# Patient Record
Sex: Male | Born: 1995 | Race: White | Hispanic: No | Marital: Single
Health system: Southern US, Community
[De-identification: ages and names within clinical notes are randomized; demographics above are authoritative.]

---
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
## 2017-01-24 ENCOUNTER — Ambulatory Visit: Payer: Self-pay | Admitting: General Surgery

## 2017-05-30 IMAGING — US US SCROTUM
1 series · 14 of 25 positions shown · non-contrast
Comparison: None.

CLINICAL DATA: Patient with right scrotal pain and swelling for 2
weeks.

EXAM:
SCROTAL ULTRASOUND
DOPPLER ULTRASOUND OF THE TESTICLES
TECHNIQUE: Complete ultrasound examination of the testicles, epididymis, and
other scrotal structures was performed. Color and spectral Doppler
ultrasound were also utilized to evaluate blood flow to the
testicles.

[Series 1: us scrotum · 0.07mm/px · 14 of 41 slices shown]
[im 1/41]
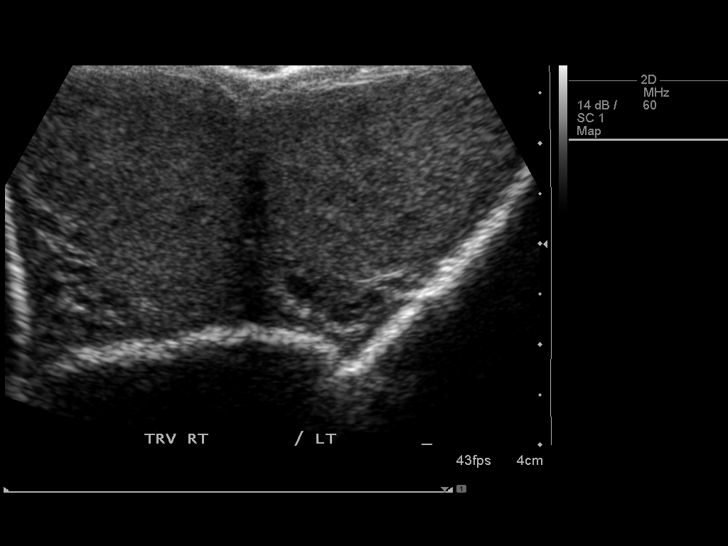
[im 4/41]
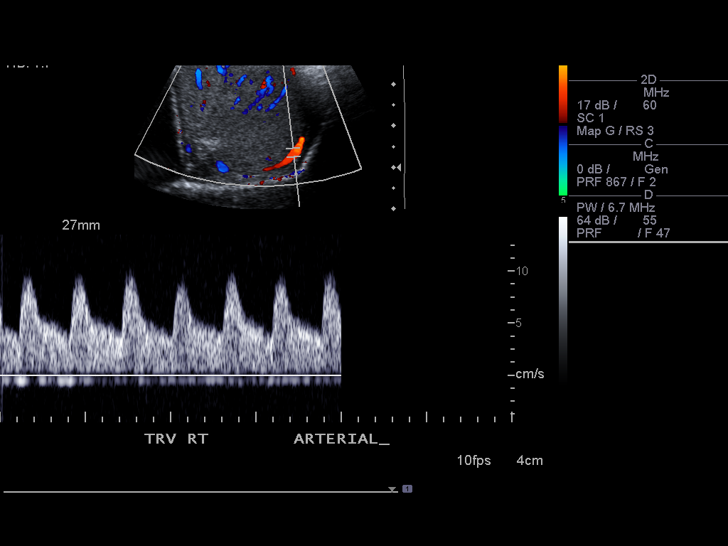
[im 7/41]
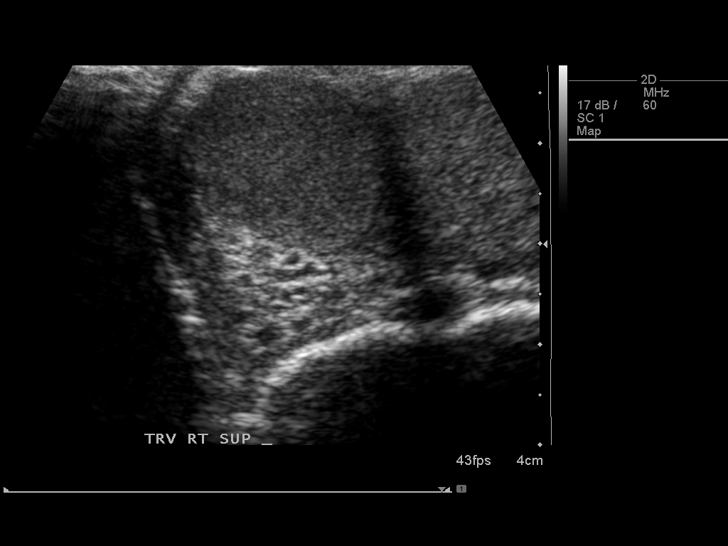
[im 11/41]
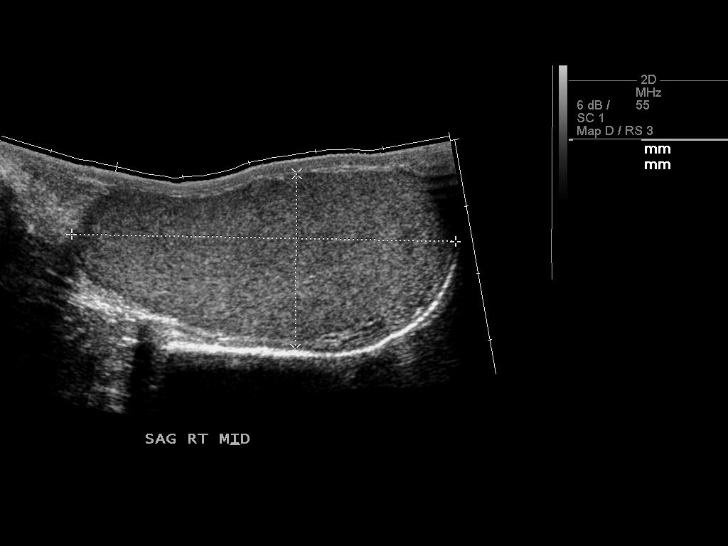
[im 14/41]
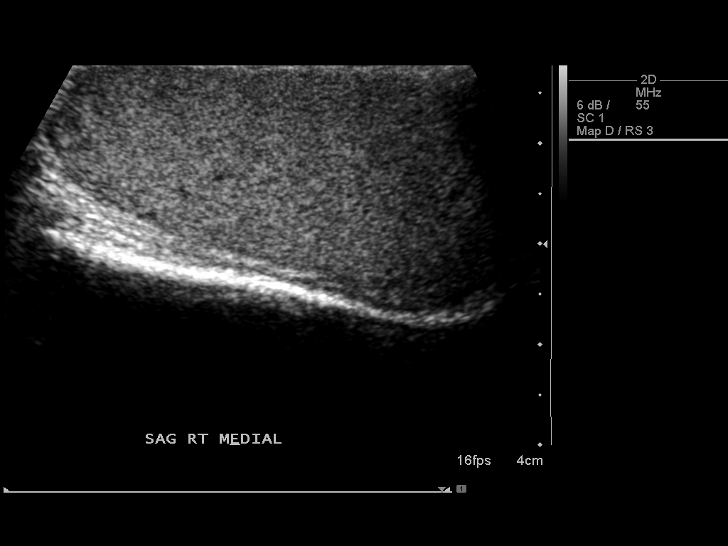
[im 16/41]
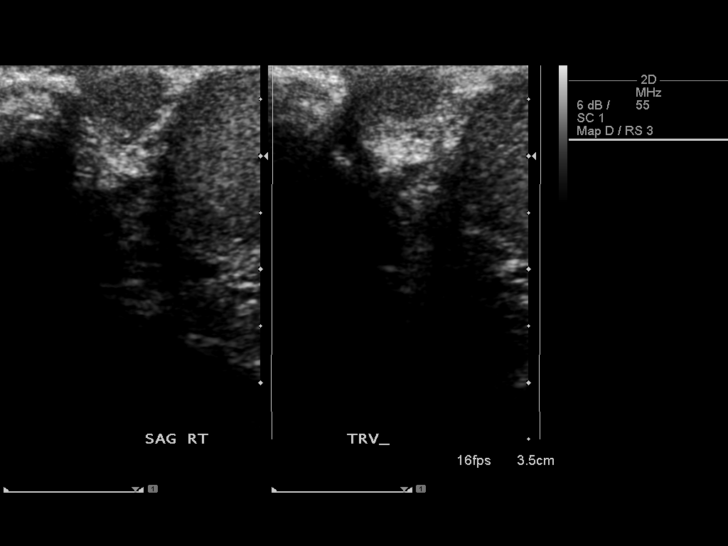
[im 19/41]
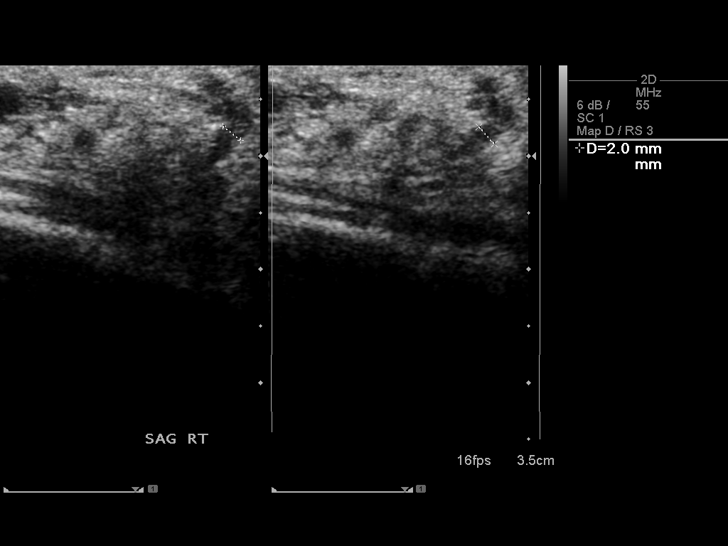
[im 22/41]
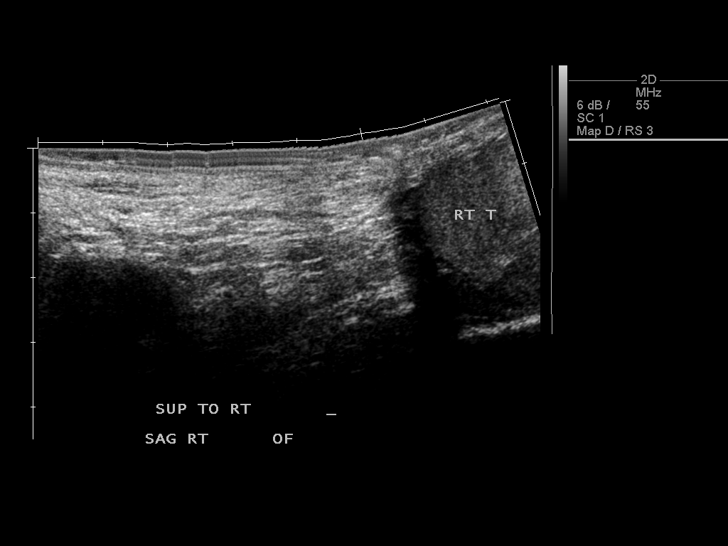
[im 26/41]
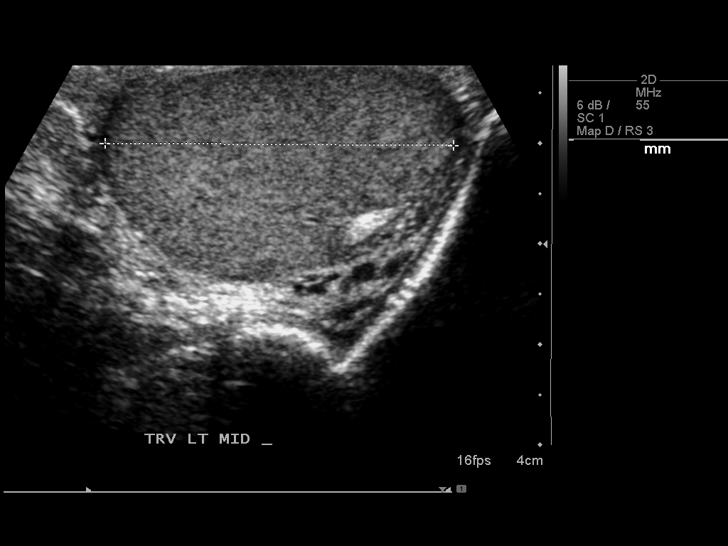
[im 27/41]
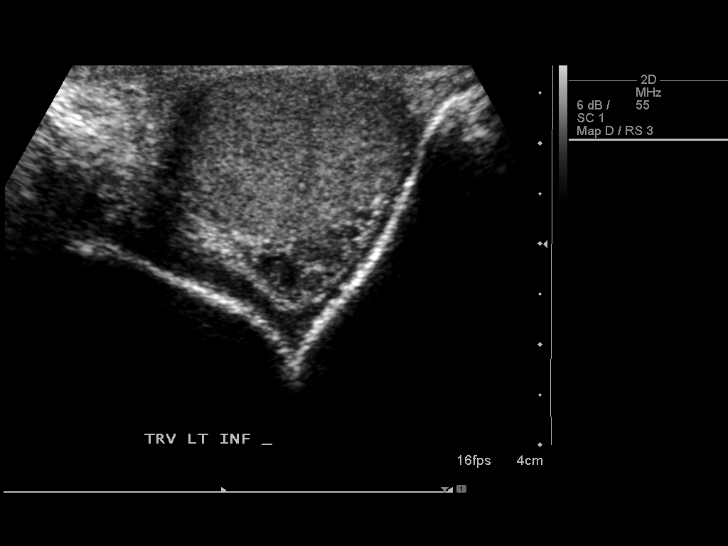
[im 31/41]
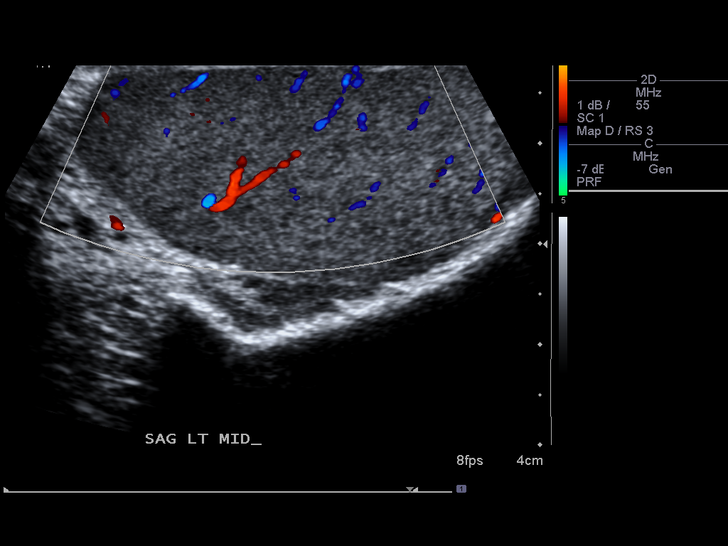
[im 34/41]
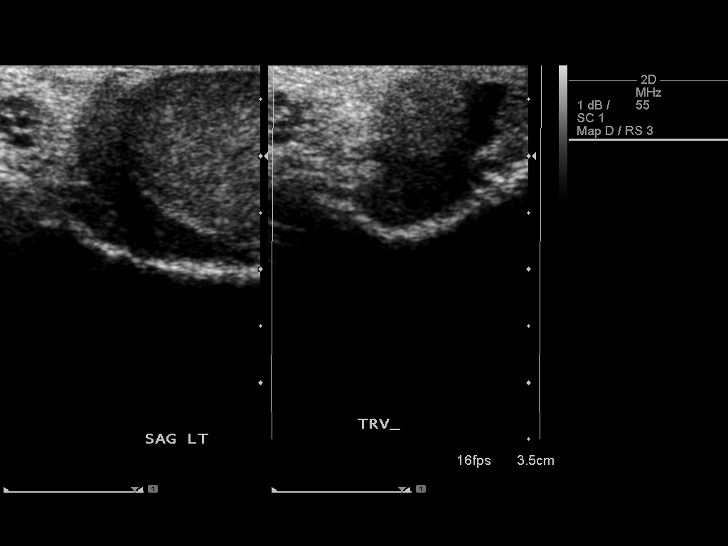
[im 37/41]
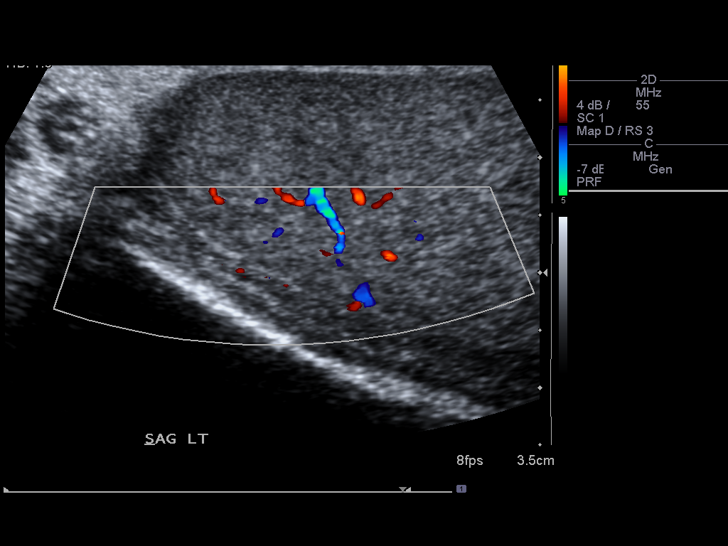
[im 41/41]
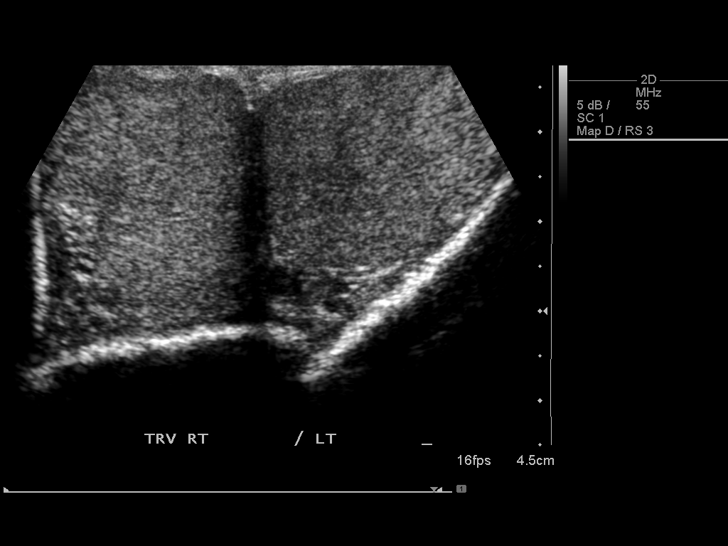

[14 of 25 positions shown; findings below may reference images not displayed]

FINDINGS: Right testicle

Measurements: 5.7 x 2.6 x 3.2 cm. No mass or microlithiasis
visualized.

Left testicle

Measurements: 5.3 x 2.4 x 3.5 cm. No mass or microlithiasis
visualized.

Right epididymis:  Normal in size and appearance.

Left epididymis:  Normal in size and appearance.

Hydrocele:  None visualized.

Varicocele:  None visualized.

Pulsed Doppler interrogation of both testes demonstrates normal low
resistance arterial and venous waveforms bilaterally.
IMPRESSION: Normal sonographic appearance of the testicles bilaterally.

## 2019-10-05 ENCOUNTER — Other Ambulatory Visit: Payer: Self-pay

## 2019-10-05 DIAGNOSIS — Z20822 Contact with and (suspected) exposure to covid-19: Secondary | ICD-10-CM

## 2019-10-07 LAB — NOVEL CORONAVIRUS, NAA: SARS-CoV-2, NAA: NOT DETECTED

## 2020-12-22 ENCOUNTER — Other Ambulatory Visit: Payer: BLUE CROSS/BLUE SHIELD
